# Patient Record
Sex: Female | Born: 1950 | Race: White | Hispanic: No | Smoking: Never smoker
Health system: Southern US, Community
[De-identification: ages and names within clinical notes are randomized; demographics above are authoritative.]

## PROBLEM LIST (undated history)

## (undated) DIAGNOSIS — I071 Rheumatic tricuspid insufficiency: Secondary | ICD-10-CM

## (undated) DIAGNOSIS — M8580 Other specified disorders of bone density and structure, unspecified site: Secondary | ICD-10-CM

## (undated) DIAGNOSIS — F419 Anxiety disorder, unspecified: Secondary | ICD-10-CM

## (undated) DIAGNOSIS — R918 Other nonspecific abnormal finding of lung field: Secondary | ICD-10-CM

## (undated) DIAGNOSIS — E559 Vitamin D deficiency, unspecified: Secondary | ICD-10-CM

## (undated) DIAGNOSIS — E785 Hyperlipidemia, unspecified: Secondary | ICD-10-CM

## (undated) DIAGNOSIS — R0602 Shortness of breath: Secondary | ICD-10-CM

## (undated) DIAGNOSIS — G4733 Obstructive sleep apnea (adult) (pediatric): Secondary | ICD-10-CM

## (undated) DIAGNOSIS — I059 Rheumatic mitral valve disease, unspecified: Secondary | ICD-10-CM

## (undated) DIAGNOSIS — I3481 Nonrheumatic mitral (valve) annulus calcification: Secondary | ICD-10-CM

## (undated) DIAGNOSIS — R079 Chest pain, unspecified: Secondary | ICD-10-CM

## (undated) HISTORY — DX: Other nonspecific abnormal finding of lung field: R91.8

## (undated) HISTORY — DX: Vitamin D deficiency, unspecified: E55.9

## (undated) HISTORY — PX: OOPHORECTOMY: SHX86

## (undated) HISTORY — DX: Nonrheumatic mitral (valve) annulus calcification: I34.81

## (undated) HISTORY — DX: Hyperlipidemia, unspecified: E78.5

## (undated) HISTORY — DX: Chest pain, unspecified: R07.9

## (undated) HISTORY — PX: OTHER SURGICAL HISTORY: SHX169

## (undated) HISTORY — DX: Anxiety disorder, unspecified: F41.9

## (undated) HISTORY — DX: Shortness of breath: R06.02

## (undated) HISTORY — PX: BREAST ENHANCEMENT SURGERY: SHX7

## (undated) HISTORY — DX: Rheumatic tricuspid insufficiency: I07.1

## (undated) HISTORY — DX: Other specified disorders of bone density and structure, unspecified site: M85.80

## (undated) HISTORY — DX: Rheumatic mitral valve disease, unspecified: I05.9

## (undated) HISTORY — DX: Obstructive sleep apnea (adult) (pediatric): G47.33

## (undated) HISTORY — PX: ABDOMINAL HYSTERECTOMY: SHX81

---

## 1979-07-02 HISTORY — PX: AUGMENTATION MAMMAPLASTY: SUR837

## 2000-03-04 ENCOUNTER — Ambulatory Visit (HOSPITAL_COMMUNITY): Admission: RE | Admit: 2000-03-04 | Discharge: 2000-03-04 | Payer: Self-pay | Admitting: Family Medicine

## 2000-03-04 ENCOUNTER — Encounter: Payer: Self-pay | Admitting: Family Medicine

## 2001-03-17 ENCOUNTER — Other Ambulatory Visit: Admission: RE | Admit: 2001-03-17 | Discharge: 2001-03-17 | Payer: Self-pay | Admitting: Internal Medicine

## 2001-11-30 ENCOUNTER — Inpatient Hospital Stay (HOSPITAL_COMMUNITY): Admission: EM | Admit: 2001-11-30 | Discharge: 2001-12-08 | Payer: Self-pay | Admitting: Psychiatry

## 2001-12-09 ENCOUNTER — Other Ambulatory Visit (HOSPITAL_COMMUNITY): Admission: RE | Admit: 2001-12-09 | Discharge: 2001-12-18 | Payer: Self-pay | Admitting: Psychiatry

## 2003-01-18 ENCOUNTER — Ambulatory Visit (HOSPITAL_COMMUNITY): Admission: RE | Admit: 2003-01-18 | Discharge: 2003-01-18 | Payer: Self-pay | Admitting: Internal Medicine

## 2003-01-18 ENCOUNTER — Encounter: Payer: Self-pay | Admitting: Internal Medicine

## 2003-05-24 ENCOUNTER — Encounter: Admission: RE | Admit: 2003-05-24 | Discharge: 2003-05-24 | Payer: Self-pay | Admitting: Internal Medicine

## 2003-07-03 ENCOUNTER — Inpatient Hospital Stay (HOSPITAL_COMMUNITY): Admission: AD | Admit: 2003-07-03 | Discharge: 2003-07-11 | Payer: Self-pay | Admitting: Psychiatry

## 2004-01-27 ENCOUNTER — Other Ambulatory Visit: Admission: RE | Admit: 2004-01-27 | Discharge: 2004-01-27 | Payer: Self-pay | Admitting: Internal Medicine

## 2005-07-24 ENCOUNTER — Ambulatory Visit (HOSPITAL_COMMUNITY): Admission: RE | Admit: 2005-07-24 | Discharge: 2005-07-24 | Payer: Self-pay | Admitting: Internal Medicine

## 2005-08-06 ENCOUNTER — Encounter (INDEPENDENT_AMBULATORY_CARE_PROVIDER_SITE_OTHER): Payer: Self-pay | Admitting: Specialist

## 2005-08-07 ENCOUNTER — Inpatient Hospital Stay (HOSPITAL_COMMUNITY): Admission: RE | Admit: 2005-08-07 | Discharge: 2005-08-08 | Payer: Self-pay | Admitting: Obstetrics and Gynecology

## 2006-04-28 ENCOUNTER — Ambulatory Visit: Payer: Self-pay | Admitting: Psychiatry

## 2006-04-28 ENCOUNTER — Inpatient Hospital Stay (HOSPITAL_COMMUNITY): Admission: RE | Admit: 2006-04-28 | Discharge: 2006-05-05 | Payer: Self-pay | Admitting: Psychiatry

## 2006-06-17 ENCOUNTER — Ambulatory Visit: Payer: Self-pay | Admitting: Internal Medicine

## 2006-06-17 LAB — CONVERTED CEMR LAB
ALT: 14 units/L (ref 0–40)
AST: 19 units/L (ref 0–37)
Albumin: 3.7 g/dL (ref 3.5–5.2)
Alkaline Phosphatase: 53 units/L (ref 39–117)
BUN: 10 mg/dL (ref 6–23)
Basophils Absolute: 0 10*3/uL (ref 0.0–0.1)
Basophils Relative: 0 % (ref 0.0–1.0)
Bilirubin Urine: NEGATIVE
CO2: 26 meq/L (ref 19–32)
Calcium: 9.4 mg/dL (ref 8.4–10.5)
Chloride: 105 meq/L (ref 96–112)
Chol/HDL Ratio, serum: 3.1
Cholesterol: 201 mg/dL (ref 0–200)
Creatinine, Ser: 0.9 mg/dL (ref 0.4–1.2)
Crystals: NEGATIVE
Eosinophil percent: 2 % (ref 0.0–5.0)
GFR calc non Af Amer: 69 mL/min
Glomerular Filtration Rate, Af Am: 83 mL/min/{1.73_m2}
Glucose, Bld: 94 mg/dL (ref 70–99)
HCT: 38.5 % (ref 36.0–46.0)
HDL: 64.5 mg/dL (ref >39.0)
Hemoglobin: 12.7 g/dL (ref 12.0–15.0)
Ketones, ur: NEGATIVE mg/dL
LDL Cholesterol: 125 mg/dL — ABNORMAL HIGH (ref 0–99)
Lymphocytes Relative: 32.8 % (ref 12.0–46.0)
MCHC: 33 g/dL (ref 30.0–36.0)
MCV: 87.1 fL (ref 78.0–100.0)
Monocytes Absolute: 0.2 10*3/uL (ref 0.2–0.7)
Monocytes Relative: 4.5 % (ref 3.0–11.0)
Neutro Abs: 3 10*3/uL (ref 1.4–7.7)
Neutrophils Relative %: 60.7 % (ref 43.0–77.0)
Nitrite: NEGATIVE
Platelets: 244 10*3/uL (ref 150–400)
Potassium: 4.3 meq/L (ref 3.5–5.1)
RBC: 4.42 M/uL (ref 3.87–5.11)
RDW: 12.3 % (ref 11.5–14.6)
Sodium: 138 meq/L (ref 135–145)
Specific Gravity, Urine: 1.015 (ref 1.000–1.03)
TSH: 1.53 microintl units/mL (ref 0.35–5.50)
Total Bilirubin: 0.7 mg/dL (ref 0.3–1.2)
Total Protein: 6.5 g/dL (ref 6.0–8.3)
Triglyceride fasting, serum: 59 mg/dL (ref 0–149)
Urine Glucose: NEGATIVE mg/dL
Urobilinogen, UA: 0.2 (ref 0.0–1.0)
VLDL: 12 mg/dL (ref 0–40)
WBC: 4.9 10*3/uL (ref 4.5–10.5)
pH: 7.5 (ref 5.0–8.0)

## 2006-06-26 ENCOUNTER — Ambulatory Visit: Payer: Self-pay | Admitting: Internal Medicine

## 2007-05-12 ENCOUNTER — Ambulatory Visit (HOSPITAL_COMMUNITY): Admission: RE | Admit: 2007-05-12 | Discharge: 2007-05-12 | Payer: Self-pay | Admitting: Internal Medicine

## 2007-05-26 ENCOUNTER — Telehealth (INDEPENDENT_AMBULATORY_CARE_PROVIDER_SITE_OTHER): Payer: Self-pay | Admitting: *Deleted

## 2007-05-26 DIAGNOSIS — E785 Hyperlipidemia, unspecified: Secondary | ICD-10-CM

## 2007-05-26 DIAGNOSIS — F329 Major depressive disorder, single episode, unspecified: Secondary | ICD-10-CM

## 2007-05-26 DIAGNOSIS — F411 Generalized anxiety disorder: Secondary | ICD-10-CM | POA: Insufficient documentation

## 2007-05-26 DIAGNOSIS — G43009 Migraine without aura, not intractable, without status migrainosus: Secondary | ICD-10-CM | POA: Insufficient documentation

## 2007-05-26 DIAGNOSIS — F3289 Other specified depressive episodes: Secondary | ICD-10-CM | POA: Insufficient documentation

## 2007-11-25 ENCOUNTER — Other Ambulatory Visit: Admission: RE | Admit: 2007-11-25 | Discharge: 2007-11-25 | Payer: Self-pay | Admitting: Family Medicine

## 2007-12-28 ENCOUNTER — Encounter: Admission: RE | Admit: 2007-12-28 | Discharge: 2007-12-28 | Payer: Self-pay | Admitting: Cardiology

## 2008-03-17 ENCOUNTER — Inpatient Hospital Stay (HOSPITAL_BASED_OUTPATIENT_CLINIC_OR_DEPARTMENT_OTHER): Admission: RE | Admit: 2008-03-17 | Discharge: 2008-03-17 | Payer: Self-pay | Admitting: Cardiology

## 2008-10-17 ENCOUNTER — Ambulatory Visit (HOSPITAL_COMMUNITY): Admission: RE | Admit: 2008-10-17 | Discharge: 2008-10-17 | Payer: Self-pay | Admitting: Family Medicine

## 2010-04-02 ENCOUNTER — Ambulatory Visit (HOSPITAL_COMMUNITY): Admission: RE | Admit: 2010-04-02 | Discharge: 2010-04-02 | Payer: Self-pay | Admitting: Family Medicine

## 2010-07-22 ENCOUNTER — Encounter: Payer: Self-pay | Admitting: Internal Medicine

## 2010-11-13 NOTE — Cardiovascular Report (Signed)
NAMECHRISLYN, SEEDORF                  ACCOUNT NO.:  [id number]   MEDICAL RECORD NO.:  [id number]          PATIENT TYPE:  OIB   LOCATION:  1961                         FACILITY:  [facility]   PHYSICIAN:  Jake Bathe, MD      DATE OF BIRTH:  September 02, 1950   DATE OF PROCEDURE:  03/17/2008  DATE OF DISCHARGE:                            CARDIAC CATHETERIZATION   PROCEDURES:  1. Left heart catheterization.  2. Selective coronary angiography.  3. Left ventriculogram.  4. Right heart catheterization with oxygen saturations and cardiac      outputs.   INDICATIONS:  A 60 year old female with abnormal echocardiogram  demonstrating elevated pulmonary pressures as well as significant  dyspnea with normal nuclear stress test showing no evidence of ischemia.   PROCEDURE DETAILS:  An informed consent obtained.  Risk of stroke, heart  attack, death, and bleeding were explained to the patient at length.  She was placed on the catheterization table and prepped in a sterile  fashion.  Lidocaine 1% was used for local anesthesia, femoral head was  visualized in a fluoroscopy before procedure began.  Using the modified  Seldinger technique, both the femoral vein and femoral artery were  cannulated with 7-French and 4-French sheath respectively.  Swan  catheter/balloon-tipped catheter was used to obtain right-sided heart  pressures and sequential chambers.  Saturations drawn.  Cardiac outputs  were obtained utilizing 2 separate methods.  Next, the pigtail catheter  was used to cross the aortic valve into the left ventricle.  Simultaneous pressures were obtained.  Following this, the Swan catheter  was removed.  A left ventriculogram was then performed using 30 mL of  Omnipaque in the RAO position using power injection.  Gradient across  the aortic valve was then obtained.  This catheter was then exchanged  for a Judkins left #4 catheter, which was used to selectively cannulate  the left main artery and multiple  views with Omnipaque hand injection  were obtained.  This catheter was then exchanged for a no-torque  Williams right catheter, which was used to selectively cannulate the  right coronary artery and multiple views were obtained.  Following the  procedure, both sheaths were removed.  Manual compression held.  The  patient tolerated the procedure well with no hematomas.  She had mild  nausea, which Zofran was given.   CORONARY ARTERY FINDINGS:  1. Left main artery - no angiographically significant coronary artery      disease.  2. Left anterior descending artery - there is one diagonal branch.      This artery then continues to wrap around the apex.  There is no      angiographically significant coronary artery disease.  3. Circumflex artery - this artery gives rise to 2 obtuse marginal      branches.  There is no angiographically significant coronary artery      disease.  4. Right coronary artery - this is a dominant artery giving rise to      the posterior descending artery.  There is no angiographically      significant coronary  artery disease.   LEFT VENTRICULOGRAM:  There are no wall motion abnormalities.  Ejection  fraction 55-60%.  There was catheter-induced mitral regurgitation in  diastole.  No evidence of any physiologic or mitral regurgitation by  echocardiogram.  No evidence of significant mitral regurgitation by  echocardiogram.   RIGHT HEART CATHETERIZATION:  1. Right atrium 12/10 with a mean of 8 mmHg.  2. Right ventricle 30/3 with a mean with an end-diastolic pressure of      12 mmHg.  3. Pulmonary artery 30/13 with a mean of 20 mmHg.  4. Pulmonary capillary wedge pressure - 28/26 with a mean of 20 mmHg.  5. Left ventricular pressure 130 systolic with a left ventricular end-      diastolic pressure of 19 mmHg.  6. Aortic pressure of 130/73 with a mean of 100 mmHg.  7. Fick cardiac output was 4.1 L per minute with a cardiac index of      2.3.  8. Thermal dilution  cardiac output was 3.3 with a cardiac index of      1.9.   OXYGEN SATURATIONS:  1. Aorta or femoral artery 95%, pulmonary artery 64%, and right atrium      73%.  Left ventricle/pulmonary capillary wedge simultaneous - no      evidence of mitral stenosis.  2. Right ventricular hemodynamics with left ventricular hemodynamic      simultaneously showed no evidence of concordance or discordance.      The right ventricular tracing showed a mild square root sign.   IMPRESSION:  1. No angiographically significant coronary artery disease.  2. Normal left ventricular ejection fraction of 55-60% with no wall      motion abnormalities with catheter-induced mitral regurgitation.  3. Normal right-sided heart pressures with mean pulmonary artery      pressure of 20 mmHg.  4. Pulmonary artery systolic pressure was 30 mmHg.  On echocardiogram,      this was estimated at 40-50 mmHg with moderate tricuspid      regurgitation.  5. Mildly elevated left ventricular end-diastolic pressure/pulmonary      capillary wedge pressure indicative of diastolic dysfunction.   PLAN:  Reassuring pulmonary artery pressure with no evidence of  pulmonary hypertension.  Echocardiogram results may have been secondary  to increased cardiac output/contractile performance during  echocardiography.  Her CT angiogram showed no evidence of pulmonary  embolism and no evidence of intrinsic lung disease.  She has been  diagnosed with obstructive sleep apnea and is currently being fitted for  mask.  We will see back in clinic in 2 weeks for followup.  Given  indications of diastolic dysfunction, we would recommend aggressively  managing blood pressure.  She currently is on no antihypertensives;  however, I will add low-dose metoprolol to her drug regimen to improve  relaxation.  If this causes excess fatigue, one may consider low dose  diuretic such as hydrochlorothiazide or perhaps diltiazem.      Jake Bathe, MD   Electronically Signed     MCS/MEDQ  D:  03/17/2008  T:  03/17/2008  Job:  [id number]   cc:   Juluis Rainier, M.D.

## 2010-11-16 NOTE — Discharge Summary (Signed)
NAME:  Tanya Barton, Tanya Barton                  ACCOUNT NO.:  [id number]   MEDICAL RECORD NO.:  [id number]          PATIENT TYPE:  INP   LOCATION:  1621                         FACILITY:  [facility]   PHYSICIAN:  Dineen Kid. Rana Snare, M.D.    DATE OF BIRTH:  1950-11-10   DATE OF ADMISSION:  08/06/2005  DATE OF DISCHARGE:  08/08/2005                                 DISCHARGE SUMMARY   HISTORY OF PRESENT ILLNESS:  Tanya Barton is a 60 year old postmenopausal  female status post bilateral salpingo-oophorectomy last year with bilateral  ovarian cyst.  Has noticed worsening problems with symptoms of pelvic  relaxation including stress urinary incontinence and also digitization for  bowel movements due to a large rectocele.  She decided to go to surgical  intervention, and presents for planned hysterectomy with anterior/posterior  colporrhaphy and mid urethral sling per Dr. Retta Diones.   HOSPITAL COURSE:  The patient underwent laparoscopic assisted vaginal  hysterectomy with anterior/posterior colporrhaphy and mid urethral sling by  Dr. Retta Diones.  The surgery was uncomplicated.  Estimated blood loss during  surgery was 100 cc.  Postoperative care was unremarkable.  She had good  return of bowel function and was ambulating.  However, was unable to void  and on postop day #1 did run a low-grade fever of 100.4.  By postoperative  day #2, she was tolerating a regular diet, ambulating without difficult,  voiding on her own and was afebrile and desired discharge home.  She did  have normoactive bowel sounds.  Her incisions were clean, dry and intact.   CONDITION ON DISCHARGE:  Good.   DISPOSITION:  The patient will be discharged home.   DISCHARGE MEDICATIONS:  1.  Keflex 500 b.i.d. x5 days per Dr. Retta Diones.  2.  Percocet as needed.  3.  Ibuprofen.   DISCHARGE INSTRUCTIONS:  1.  Return for increased pain, fever, bleeding, inability to void.  2.  Follow up in the week and a half to two weeks with both Dr.  Retta Diones      and Dr. Rana Snare.      Dineen Kid Rana Snare, M.D.  Electronically Signed     DCL/MEDQ  D:  08/08/2005  T:  08/08/2005  Job:  [id number]

## 2010-11-16 NOTE — Op Note (Signed)
NAME:  Tanya Barton, Tanya Barton                  ACCOUNT NO.:  [id number]   MEDICAL RECORD NO.:  [id number]          PATIENT TYPE:  OIB   LOCATION:  1621                         FACILITY:  [facility]   PHYSICIAN:  Bertram Millard. Dahlstedt, M.D.DATE OF BIRTH:  [birth year]   DATE OF PROCEDURE:  08/06/2005  DATE OF DISCHARGE:                                 OPERATIVE REPORT   PREOPERATIVE DIAGNOSIS:  Stress urinary incontinence.   POSTOPERATIVE DIAGNOSIS:  Stress urinary incontinence.   PROCEDURE:  Lynx suprapubic sling.   SURGEON:  Mark C. Vernie Ammons, M.D.   ANESTHESIA:  General.   COMPLICATIONS:  None.   BRIEF HISTORY:  [name] is a 60 year old female who presents at this time  for pubovaginal sling. She was originally referred by Dr. Candice Camp in  October for possible treatment of this. She is scheduled to have  laparoscopic-assisted vaginal hysterectomy, possible anterior and posterior  repairs. She underwent evaluation in the office and had a urodynamics. This  showed some urgency component to her incontinence but the majority of her  incontinence was stress in nature. She desires to proceed with surgical  management of the stress incontinence. She is aware of the risks and  complications. These include but are not limited to damage to the bladder,  extrusion of the sling material through the vagina with eventual need to  reoperate, bleeding, retention, among others. She understands these and  agrees to proceed.   DESCRIPTION OF PROCEDURE:  The patient had already undergone a laparoscopic-  assisted vaginal hysterectomy and a posterior repair by Dr. Rana Snare. There was  a midline vaginal incision high within the vaginal vault. This was extended  inferiorly towards the urethral meatus. A catheter was in place. Dissection  was carried on either side of the vaginal incision up to the pubocervical  fascia. Two small puncture sites were made in the suprapubic region on each  side of the midline. The  suprapubic needles were then passed through each  one of these, down on either side of the vaginal incision through the space  of Retzius. Following passage of both needles, cystoscopic view of the  bladder and urethra revealed no injury. The [place] sling was then grasped and  pulled such that the mid part of the sling cradled mid urethra.  Approximately 1 cm of slack was left underneath the urethra. The sheath  overlying the sling was then removed. Good positioning was seen underneath  the urethra. The vaginal incision was then closed using a running #0  Monocryl. A pack was placed in the vagina - gauze with Estrace cream. The  suprapubic wound  sites were then closed with Dermabond after the extraneous sling was  excised. The patient tolerated the procedure well. Estimated low blood loss  was approximately 20 mL. She was awakened and returned to the PACU in stable  condition.      Bertram Millard. Dahlstedt, M.D.  Electronically Signed     SMD/MEDQ  D:  08/06/2005  T:  08/06/2005  Job:  [id number]   cc:   Dineen Kid. Rana Snare, M.D.  Fax: [phone]

## 2010-11-16 NOTE — H&P (Signed)
NAME:  Tanya Barton, Tanya Barton                            ACCOUNT NO.:  [id number]   MEDICAL RECORD NO.:  [id number]                   PATIENT TYPE:  IPS   LOCATION:  0300                                 FACILITY:  [facility]   PHYSICIAN:  Jeanice Lim, M.D.              DATE OF BIRTH:  1951-05-24   DATE OF ADMISSION:  07/03/2003  DATE OF DISCHARGE:                                HISTORY & PHYSICAL   IDENTIFYING INFORMATION:  A 60 year old divorced white female, voluntarily  admitted on July 04, 2003.   HISTORY OF PRESENT ILLNESS:  The patient presents with a history of  increasing depression, having panic attacks.  She has been crying all the  time.  She is wondering what's it all about.  She has been lining up all  her medications.  Considering possible overdose.  She has been having a lot  of difficulty sleeping, problems especially with middle insomnia.  Her  stressors include her son is an alcoholic, who is now disrespectful towards  her and her son is currently living with his alcoholic father.  She states  that she knew she needed help when she began having some suicidal thoughts.  She states her appetite has been satisfactory.  She denies any psychotic  symptoms, reports some racing thoughts.   PAST PSYCHIATRIC HISTORY:  Second hospitalization, was here in 2003 for  depression and some passive suicidal thoughts, no current outpatient  treatment.  Was seeing Dr. Katrinka Blazing.  No history of a suicide attempt, and does  go to [employer].   SOCIAL HISTORY:  She is a 60 year old divorced white female, divorced for 17  years.  Has 2 children both adults.  She lives alone.  She is on disability.  No legal problems.   FAMILY HISTORY:  Denies.   ALCOHOL DRUG HISTORY:  Nonsmoker, denies any drug use or alcohol.   PAST MEDICAL HISTORY:  Primary care Keaston Pile is Dr. Gabriel Rung at [facility].  Medical problems are hypertension.   MEDICATIONS:  Lexapro 20 mg daily, has been on it for 1 year, Xanax  t.i.d.,  has been on it for, as she states, a long time.  Verapamil 240 mg daily,  Relafen for hip pain and trazodone for sleep.   DRUG ALLERGIES:  Z-PAK.   PHYSICAL EXAMINATION:  Performed.  There were no significant findings.  The  patient is a well-nourished middle-aged female.  Her vital signs are stable:  98, 60, 16 respirations, blood pressure is 120/80.   LABORATORY DATA:  CBC is within normal limits.  CMET:  Has blood sugar of  100.   MENTAL STATUS EXAM:  She is an alert, oriented, calm female, cooperative.  Speech clear, mood is depressed and anxious.  The patient is teary-eyed at  times.  Thought process:  The patient is very worried over her children but  does not appear to be distracted by any  internal stimuli.  Cognitive  function intact.  Memory is good, judgment and insight is good.   ADMISSION DIAGNOSES:   AXIS I:  Major depressive disorder, recurrent.   AXIS II:  Deferred.   AXIS III:  Hypertension.   AXIS IV:  Problems with primary support group, other psychosocial problems.   AXIS V:  Current is 30, past year 60.   PLAN:  Voluntary admission for suicidal ideation.  Contract for safety,  check every 15 minutes.  Will stabilize mood and thinking so the patient can  be safe and functional.  We will increase an antidepressant to decrease  depressive symptoms.  Will add Risperdal for obsessive thinking and  ruminating over worries.  Case manager to look at follow-up for the patient.  The patient is to continue to be medication compliant and to continue going  to [employer].   TENTATIVE LENGTH OF CARE:  3-5 days.     Landry Corporal, N.P.                       Jeanice Lim, M.D.    JO/MEDQ  D:  07/08/2003  T:  07/08/2003  Job:  [id number]

## 2010-11-16 NOTE — Op Note (Signed)
NAME:  Tanya Barton, Tanya Barton                  ACCOUNT NO.:  [id number]   MEDICAL RECORD NO.:  [id number]          PATIENT TYPE:  INP   LOCATION:  1621                         [facility]ACILITY:  [facility]   PHYSICIAN:  Dineen Kid. Rana Snare, M.D.    DATE OF BIRTH:  02-07-1951   DATE OF PROCEDURE:  08/06/2005  DATE OF DISCHARGE:                                 OPERATIVE REPORT   PREOPERATIVE DIAGNOSES:  Symptomatic pelvic relaxation with cystocele and  rectocele and stress urinary incontinence.   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   PROCEDURE:  Laparoscopically assisted vaginal hysterectomy with anterior and  posterior colporrhaphy and mid urethral sling by Dr. Retta Diones.   INDICATIONS FOR PROCEDURE:  Tanya Barton is a 60 year old postmenopausal  female status post bilateral salpingo-oophorectomy last year for bilateral  ovarian cyst. Has had worsening problems with symptoms from pelvic  relaxation including stress urinary incontinence and also digitalization  with bowel movements due to large rectocele. She desires definitive surgical  intervention and planned anterior and posterior colporrhaphy with urethral  sling but also plan laparoscopically assisted vaginal hysterectomy. The  risks and benefits of the procedure were discussed at length including but  not limited to, risk of infection, bleeding, damage to bowel, bladder,  ureters, risk of recurrence, risk associated with anesthesia and risk  associated with blood transfusion. She does give her informed consent and  wishes to proceed.   FINDINGS:  Normal appearing uterus, ovaries were surgically absent, normal  appearing liver and appendix.   DESCRIPTION OF PROCEDURE:  After adequate analgesia, the patient placed in  the dorsal lithotomy position, she was sterilely prepped and draped. The  bladder was sterilely drained. A Hulka tenaculum was placed on the cervix  and 1 cm  infraumbilical skin incision was made, a Veress needle was  inserted, the abdomen insufflated with dullness to percussion. An 11 mm  trocar was inserted, a laparoscope was inserted and the above findings were  noted. A 5 mm trocar was inserted to the left of the midline 2  fingerbreadth's from the pubic symphysis. The round ligaments were ligated  and dissected using gyrus grasping and cutting forceps. The broad ligaments  were dissected bilaterally down to the level of the uterine artery with good  hemostasis achieved and care taken to avoid the underlying ureters. Bladder  flap is created elevating the uterovesical junction and dissecting with  endoshears. The abdomen was then desufflated, legs repositioned, a weighted  speculum was placed in the vagina. A posterior colpotomy was performed, the  cervix was circumscribed with Bovie cautery. The uterosacral ligaments were  ligated with a LigaSure and dissected with Mayo scissors. The bladder  pillars were dissected in a similar fashion, anterior vaginal mucosa was  dissected off the anterior uterus. The anterior peritoneum was entered  sharply and Deaver retractors placed underneath the bladder. The inferior  portions of the broad ligament and the uterine vasculature were ligated with  LigaSure. The fundus of the uterus was then delivered into the introitus and  the remaining portion  of the pedicle was dissected and ligated with a  LigaSure instrument. The uterus was then removed intact. The uterosacral  ligaments were identified, suture ligated with a #0 Monocryl suture in a  figure-of-eight fashion. The posterior peritoneum was then closed in a  pursestring fashion. The uterosacral ligaments appeared the have very good  support. They were plicated at the midline and the posterior vaginal mucosa  was closed in a vertical fashion using figure-of-eights of #0 Monocryl  suture. The anterior vaginal mucosa was closed after dissecting more   anteriorly underneath the cystocele. The perivesicular fascia was then  plicated using figure-of-eights of #0 Monocryl suture and the anterior  vaginal mucosa was closed with figure-of-eights of #0 Monocryl suture  digging a small window just below the mid urethra which was not closed so  that Dr. Retta Diones could perform his urethropexy. The peritoneal body was  grasped on each side of the introitus with Allis clamps, a triangular flap  was created with a scalpel. The posterior vaginal mucosa was undermined with  Metzenbaum scissors, dissected in the mid portion and reflected laterally  with Metzenbaum scissors two thirds of the way up the vagina to the apex.  After reflecting the vaginal mucosa laterally, the rectal fascia was then  plicated in the midline using figure-of-eights of #0 Monocryl suture with  good approximation of the perirectal fascia. The excess vaginal mucosa was  then removed and the posterior vaginal mucosa was closed with a 2-0 Monocryl  in running locking fashion. The peritoneal body was then reinforced using a  #0 Monocryl suture pulling the superficial transverse perinei muscles and  bulbocavernosus muscles together on the peritoneal body. The perineal  epidermis was then closed in a subcuticular fashion using the 2-0 Monocryl  suture. During the procedure the blood loss was 100 mL. The patient  tolerated this portion of the procedure well. Dr. Isabel Caprice began with a mid  urethral sling, I proceeded with the laparoscopic evaluation of the pelvis  and after examination of the pelvis and the pedicles, bipolar cautery was  used to cauterize the peritoneal edge bleeders after hemostasis was  achieved. The abdomen was then desufflated, trocars removed, and umbilical  skin incision was closed with a #0 Vicryl interrupted suture in the fascia.  A 3-0 Vicryl Rapide subcuticular suture and the 5 mm site was closed with a 3-0 Vicryl Rapide subcuticular suture. The patient  tolerated the procedure  well and was stable on transfer to the recovery room. Sponge, needle and  instrument count was normal x3. Estimated blood loss was 100 mL. The patient  received 1 gram of Rocephin preoperatively.      Dineen Kid Rana Snare, M.D.  Electronically Signed     DCL/MEDQ  D:  08/07/2005  T:  08/07/2005  Job:  [id number]
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
## 2010-11-16 NOTE — H&P (Signed)
NAME:  ANETH, SCHLAGEL                  ACCOUNT NO.:  [id number]   MEDICAL RECORD NO.:  [id number]          PATIENT TYPE:  AMB   LOCATION:  DAY                          FACILITY:  [facility]   PHYSICIAN:  Dineen Kid. Rana Snare, M.D.    DATE OF BIRTH:  05-13-1951   DATE OF ADMISSION:  08/06/2005  DATE OF DISCHARGE:                                HISTORY & PHYSICAL   HISTORY OF PRESENT ILLNESS:  Ms. Quackenbush is a 60 year old, G5, P2, A3, who  has been having problems with difficulty with bowel movement, intravaginal  pressure, having to digitalize to have a bowel movement. She also has stress  urinary incontinence to pressure anteriorly. Previously had a history of  bilateral ovarian cyst. She had a bilateral salpingo-oophorectomy  laparoscopically back in 2005. She does still have a uterus. She does not  have any problems with bleeding but at this time desires definitive surgical  intervention and treatment for pelvic relaxation due to cystocele and  rectocele. She has been evaluated by Dr. Retta Diones for stress urinary  incontinence and he plans to perform midurethral sling.   Itzelle's past medical history is significant for anxiety which she takes  Klonopin as needed and Lexapro 10 milligrams a day. Hypertension which she  takes verapamil and a baby aspirin a day. She has an allergy to  AZITHROMYCIN.   PAST SURGICAL HISTORY:  She has had two cesarean sections. She has had a  laparoscopic bilateral salpingo-oophorectomy in 2005. She has had five  pregnancies and three miscarriages at approximately [redacted] weeks gestational  age.   PHYSICAL EXAMINATION:  Blood pressure is 120/86. Heart regular rate and  rhythm. Lungs are clear to auscultation bilaterally. Abdomen is nondistended  and nontender. Pelvic exam has normal external genitalia. Bartholin's,  Skene's and urethra shows she does have a second and third degree cystocele  with second degree loss of the UV angle with Valsalva. She does have a third  degree  rectocele. No obvious enterocele is noted. 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 Peroneal body is intact.   IMPRESSION/PLAN:  Symptomatic pelvic relaxation with cystocele, urethrocele,  and rectocele. After discussing multiple options with Janiesha, she desires  surgical intervention. Plan anterior and posterior colporrhaphy with  urethral sling. We will also perform a laparoscopic-assisted vaginal  hysterectomy at the time. Discussed the risks and benefits of the procedure  at length which include but are not limited to risks of infection, bleeding,  damage to the bowel, bladder, ureters, risks associated with bladder surgery  such as incontinence or prolonged catheter wear, possibly that this may  recur in the future. Risks associated with anesthesia, risks associated with  blood transfusion. She does give her informed consent and wishes to proceed.  Currently, her medications include Lexapro at 40 milligrams a day,  Wellbutrin at 150 milligrams a day, Klonopin as t.i.d. She is also on  Trazodone 200 milligrams at bed time.      Dineen Kid Rana Snare, M.D.  Electronically Signed     DCL/MEDQ  D:  08/05/2005  T:  08/06/2005  Job:  [id number]
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
## 2011-03-15 ENCOUNTER — Other Ambulatory Visit (HOSPITAL_COMMUNITY): Payer: Self-pay | Admitting: Family Medicine

## 2011-03-15 DIAGNOSIS — Z1231 Encounter for screening mammogram for malignant neoplasm of breast: Secondary | ICD-10-CM

## 2011-04-01 LAB — POCT I-STAT 3, ART BLOOD GAS (G3+)
Acid-Base Excess: 1
Bicarbonate: 25.3 — ABNORMAL HIGH
O2 Saturation: 95
TCO2: 27
pCO2 arterial: 39.9
pH, Arterial: 7.411 — ABNORMAL HIGH
pO2, Arterial: 77 — ABNORMAL LOW

## 2011-04-01 LAB — POCT I-STAT 3, VENOUS BLOOD GAS (G3P V)
Acid-base deficit: 1
Bicarbonate: 26.1 — ABNORMAL HIGH
Bicarbonate: 26.4 — ABNORMAL HIGH
O2 Saturation: 64
O2 Saturation: 73
TCO2: 28
TCO2: 28
pCO2, Ven: 47
pCO2, Ven: 52 — ABNORMAL HIGH
pH, Ven: 7.314 — ABNORMAL HIGH
pH, Ven: 7.353 — ABNORMAL HIGH
pO2, Ven: 37
pO2, Ven: 41

## 2011-04-04 ENCOUNTER — Other Ambulatory Visit (HOSPITAL_COMMUNITY): Payer: Self-pay | Admitting: Family Medicine

## 2011-04-04 ENCOUNTER — Ambulatory Visit (HOSPITAL_COMMUNITY)
Admission: RE | Admit: 2011-04-04 | Discharge: 2011-04-04 | Disposition: A | Discharge status: Home / Self Care | Payer: Medicare Other | Source: Ambulatory Visit | Attending: Family Medicine | Admitting: Family Medicine

## 2011-04-04 DIAGNOSIS — Z1231 Encounter for screening mammogram for malignant neoplasm of breast: Secondary | ICD-10-CM

## 2011-06-19 ENCOUNTER — Other Ambulatory Visit: Payer: Self-pay | Admitting: Family Medicine

## 2011-06-19 DIAGNOSIS — R911 Solitary pulmonary nodule: Secondary | ICD-10-CM

## 2011-06-27 ENCOUNTER — Ambulatory Visit
Admission: RE | Admit: 2011-06-27 | Discharge: 2011-06-27 | Disposition: A | Discharge status: Home / Self Care | Payer: Medicare Other | Source: Ambulatory Visit | Attending: Family Medicine | Admitting: Family Medicine

## 2011-06-27 DIAGNOSIS — R911 Solitary pulmonary nodule: Secondary | ICD-10-CM

## 2012-04-24 ENCOUNTER — Other Ambulatory Visit (HOSPITAL_COMMUNITY): Payer: Self-pay | Admitting: Family Medicine

## 2012-04-24 DIAGNOSIS — Z1231 Encounter for screening mammogram for malignant neoplasm of breast: Secondary | ICD-10-CM

## 2012-05-21 ENCOUNTER — Ambulatory Visit (HOSPITAL_COMMUNITY)
Admission: RE | Admit: 2012-05-21 | Discharge: 2012-05-21 | Disposition: A | Discharge status: Home / Self Care | Payer: Medicare Other | Source: Ambulatory Visit | Attending: Family Medicine | Admitting: Family Medicine

## 2012-05-21 DIAGNOSIS — Z1231 Encounter for screening mammogram for malignant neoplasm of breast: Secondary | ICD-10-CM | POA: Insufficient documentation

## 2013-04-27 ENCOUNTER — Telehealth: Payer: Self-pay | Admitting: Cardiology

## 2013-04-27 NOTE — Telephone Encounter (Signed)
New problem    For a couple of weeks pt has been having Tachycardia.  More so in the mornings all through the day.   Pt would like a call back please on what she should do.   Thanks!

## 2013-04-28 MED ORDER — METOPROLOL SUCCINATE ER 25 MG PO TB24: 25.0000 mg | ORAL_TABLET | Freq: Every day | ORAL | Status: DC

## 2013-04-28 NOTE — Telephone Encounter (Signed)
Follow up   Pt did not get a call 10/28.   Pt still having Tachycardia.   Needs a call back!

## 2013-04-29 ENCOUNTER — Other Ambulatory Visit (HOSPITAL_COMMUNITY): Payer: Self-pay | Admitting: Family Medicine

## 2013-04-29 DIAGNOSIS — Z1231 Encounter for screening mammogram for malignant neoplasm of breast: Secondary | ICD-10-CM

## 2013-05-26 ENCOUNTER — Other Ambulatory Visit (HOSPITAL_COMMUNITY): Payer: Self-pay | Admitting: Family Medicine

## 2013-05-26 ENCOUNTER — Ambulatory Visit (HOSPITAL_COMMUNITY)
Admission: RE | Admit: 2013-05-26 | Discharge: 2013-05-26 | Disposition: A | Discharge status: Home / Self Care | Payer: Medicare Other | Source: Ambulatory Visit | Attending: Family Medicine | Admitting: Family Medicine

## 2013-05-26 DIAGNOSIS — Z1231 Encounter for screening mammogram for malignant neoplasm of breast: Secondary | ICD-10-CM | POA: Insufficient documentation

## 2013-07-23 ENCOUNTER — Other Ambulatory Visit: Payer: Self-pay | Admitting: Cardiology

## 2013-07-29 ENCOUNTER — Other Ambulatory Visit: Payer: Self-pay | Admitting: *Deleted

## 2013-07-29 MED ORDER — METOPROLOL SUCCINATE ER 25 MG PO TB24: 25.0000 mg | ORAL_TABLET | Freq: Every day | ORAL | Status: DC

## 2013-07-29 NOTE — Telephone Encounter (Signed)
Left message regarding medication metoprolol

## 2013-07-29 NOTE — Telephone Encounter (Signed)
Patient called for metoprolol refill.

## 2013-08-11 ENCOUNTER — Ambulatory Visit (INDEPENDENT_AMBULATORY_CARE_PROVIDER_SITE_OTHER): Payer: Medicare Other | Admitting: Cardiology

## 2013-08-26 ENCOUNTER — Ambulatory Visit: Payer: Medicare Other | Admitting: Cardiology

## 2013-09-06 ENCOUNTER — Encounter: Payer: Self-pay | Admitting: Cardiology

## 2013-09-06 ENCOUNTER — Ambulatory Visit (INDEPENDENT_AMBULATORY_CARE_PROVIDER_SITE_OTHER): Payer: Medicare Other | Admitting: Cardiology

## 2013-09-06 VITALS — BP 120/70 | HR 42 | Ht 63.0 in | Wt 176.0 lb

## 2013-09-06 DIAGNOSIS — F411 Generalized anxiety disorder: Secondary | ICD-10-CM

## 2013-09-06 DIAGNOSIS — R001 Bradycardia, unspecified: Secondary | ICD-10-CM

## 2013-09-06 DIAGNOSIS — E785 Hyperlipidemia, unspecified: Secondary | ICD-10-CM

## 2013-09-06 DIAGNOSIS — F419 Anxiety disorder, unspecified: Secondary | ICD-10-CM

## 2013-09-06 DIAGNOSIS — I498 Other specified cardiac arrhythmias: Secondary | ICD-10-CM

## 2013-09-06 LAB — TSH: TSH: 2.12 u[IU]/mL (ref 0.35–5.50)

## 2013-09-06 NOTE — Progress Notes (Signed)
[address] [place], [place]  [address] Phone: [phone] Fax:  [phone]  Date:  09/06/2013   ID:  Tanya OchsSue F Mcray, DOB 04/18/1951, MRN [id number]  PCP:  No primary provider on file.   History of Present Illness: Tanya OchsSue F Barton is a 63 y.o. female with prior right and left heart catheterization showing no angiographically significant CAD (03/2008), normal EF, normal right-sided heart pressures (prior echocardiogram showed mildly elevated systolic pulmonary pressures but this was dispelled by right heart catheterization) here for followup.   Took metoprolol off with bradycardia. Occasional in the AM pulse was 100-120. When sit down heart rate decreased. Tried metoprolol 25. Still with brady here. No syncope, no dizziness with her bradycardia. She will have occasional anxiety, taking medications as described.    Wt Readings from Last 3 Encounters:  09/06/13 176 lb (79.833 kg)     Past Medical History  Diagnosis Date  . Chest pain     , CATH Right and Left   . Shortness of breath   . Obstructive sleep apnea     , CPAP  . Hyperlipidemia   . Pulmonary nodules   . Osteopenia   . Anxiety   . Tricuspid regurgitation   . Mitral annular calcification   . Vitamin D deficiency     Past Surgical History  Procedure Laterality Date  . Oophorectomy Bilateral   . Abdominal hysterectomy    . Breast enhancement surgery    . Cesarean section      Current Outpatient Prescriptions  Medication Sig Dispense Refill  . aspirin 81 MG tablet Take 81 mg by mouth daily.      . calcium-vitamin D (OSCAL WITH D) 500-200 MG-UNIT per tablet Take 1 tablet by mouth.      . citalopram (CELEXA) 20 MG tablet       . clonazePAM (KLONOPIN) 1 MG tablet       . metoprolol succinate (TOPROL XL) 25 MG 24 hr tablet Take 1 tablet (25 mg total) by mouth daily.  30 tablet  0  . Multiple Vitamins-Minerals (MULTIVITAL-M PO) Take by mouth.      . traZODone (DESYREL) 100 MG tablet        No  current facility-administered medications for this visit.    Allergies:    Allergies  Allergen Reactions  . Azithromycin     Social History:  The patient  reports that she has never smoked. She does not have any smokeless tobacco history on file. She reports that she does not drink alcohol or use illicit drugs.   ROS:  Please see the history of present illness.   Denies any fevers, chills, orthopnea, PND, no chest pain  PHYSICAL EXAM: VS:  BP 120/70  Pulse 42  Ht 5\' 3"  (1.6 m)  Wt 176 lb (79.833 kg)  BMI 31.18 kg/m2 Well nourished, well developed, in no acute distress HEENT: normal Neck: no JVD Cardiac:  normal S1, S2; brady; no murmur Lungs:  clear to auscultation bilaterally, no wheezing, rhonchi or rales Abd: soft, nontender, no hepatomegaly Ext: no edema Skin: warm and dry Neuro: no focal abnormalities noted  EKG:  09/06/13-Sinus bradycardia rate 42 with no other abnormalities  no change from prior EKG Labs: LDL 92 on12/14  ASSESSMENT AND PLAN:  1. Chest pain previously described chest pain now resolved with reassuring cardiac catheterization. 2. Sinus bradycardia-no change from previous. Will stop metoprolol 25. Will check TSH.  3. Anxiety - meds  reviewed.  4. Hyperlipidemia-continue with dietary control. 5. Year f/u  Signed, Donato Schultz, MD [facility]  09/06/2013 1:30 PM

## 2013-09-06 NOTE — Patient Instructions (Signed)
Your physician has recommended you make the following change in your medication:   1. Stop Metoprolol  Your physician recommends that you have labs today: TSH  Your physician wants you to follow-up in: 1 year with Dr. Anne FuSkains, You will receive a reminder letter in the mail two months in advance. If you don't receive a letter, please call our office to schedule the follow-up appointment.

## 2013-09-07 ENCOUNTER — Telehealth: Payer: Self-pay | Admitting: Cardiology

## 2013-09-07 NOTE — Telephone Encounter (Signed)
New message ° ° ° ° ° ° ° ° ° °Pt returning nurses call °

## 2013-09-08 NOTE — Telephone Encounter (Signed)
Patient is returning call, she is not happy that no one has called her back. Please advise.

## 2013-09-08 NOTE — Telephone Encounter (Signed)
Pt was given TSH results/ verbalized understanding.

## 2013-10-04 IMAGING — CT CT CHEST W/O CM
2 of 3 series · 15 of 36 positions shown, 18 images · non-contrast
Comparison: CT 12/28/2007.

CLINICAL DATA: Follow-up pulmonary nodules.  No history of smoking
or malignancy.

CT CHEST WITHOUT CONTRAST
TECHNIQUE: Multidetector CT imaging of the chest was performed
following the standard protocol without IV contrast.

[Series 100: standard · axial · 0.59mm/px · z∈[-311,-76]mm · 12 of 57 slices shown, 15 images]
[im 5/57  mediastinal]
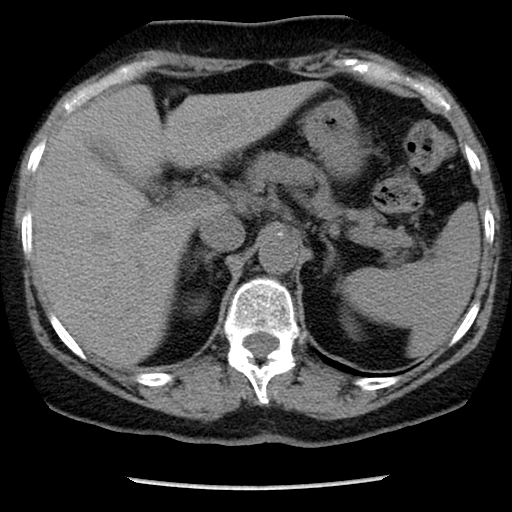
[im 5/57  lung]
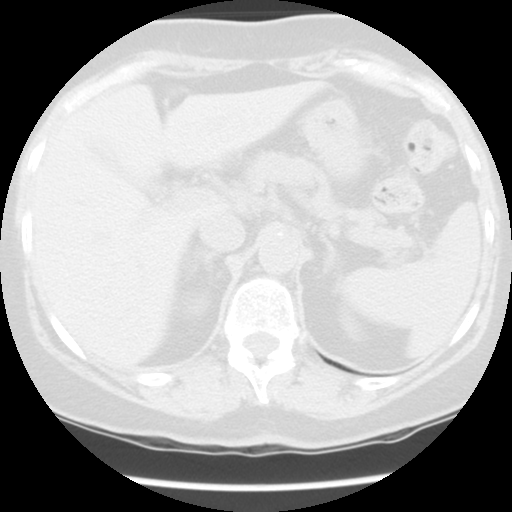
[im 9/57  lung]
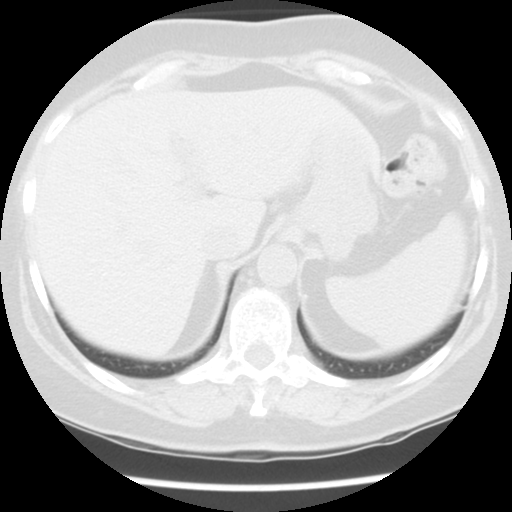
[im 13/57  lung]
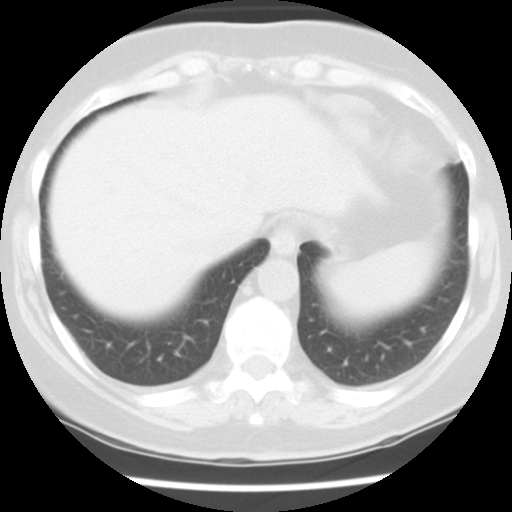
[im 17/57  lung]
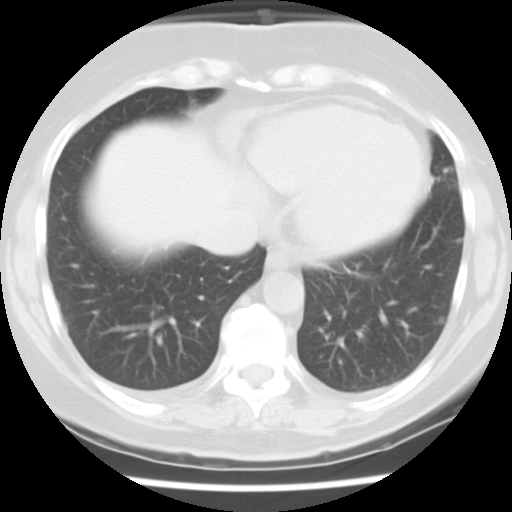
[im 21/57  mediastinal]
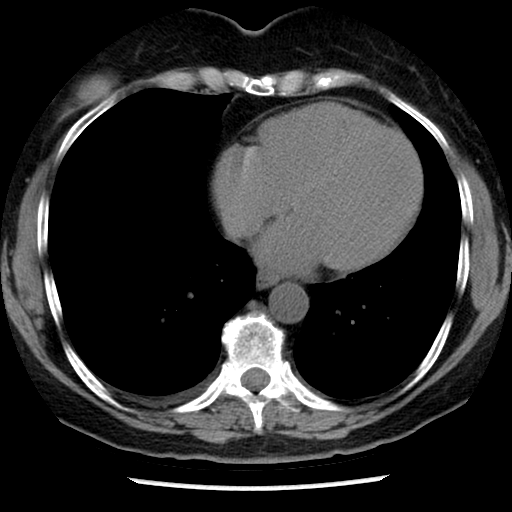
[im 21/57  lung]
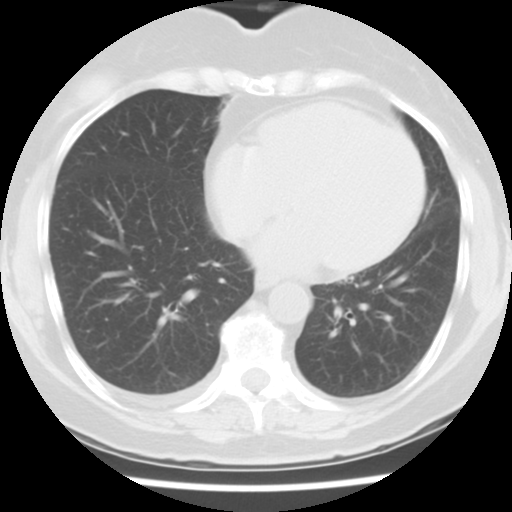
[im 25/57  lung]
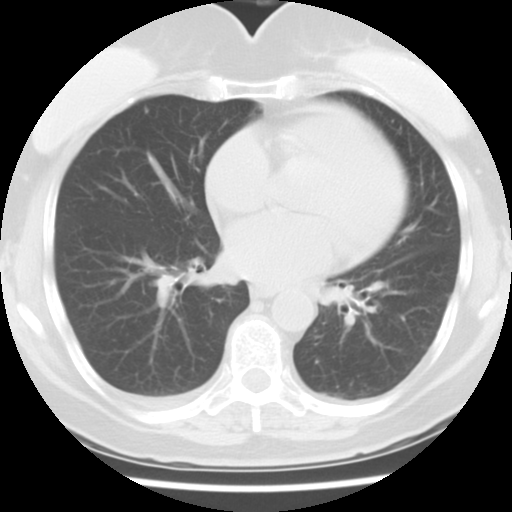
[im 32/57  lung]
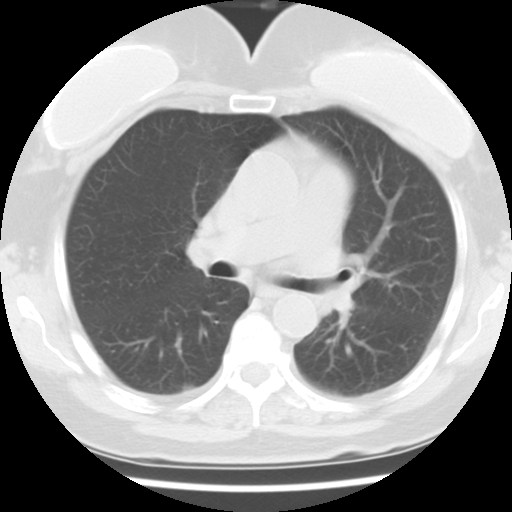
[im 36/57  lung]
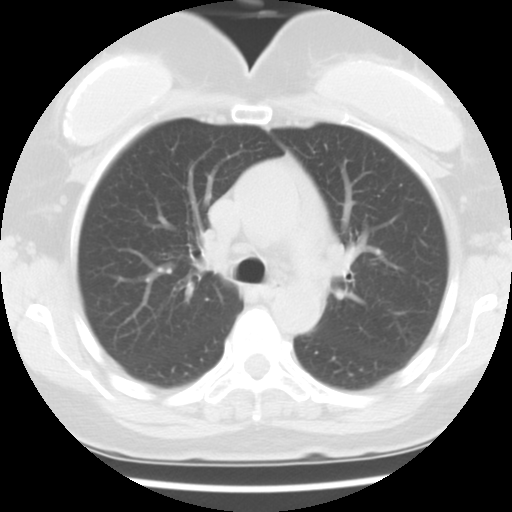
[im 40/57  mediastinal]
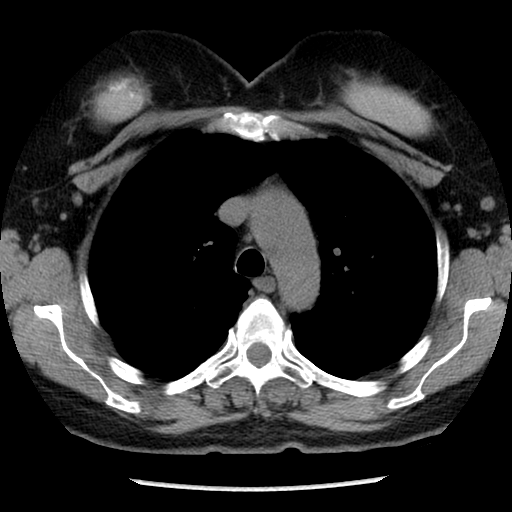
[im 40/57  lung]
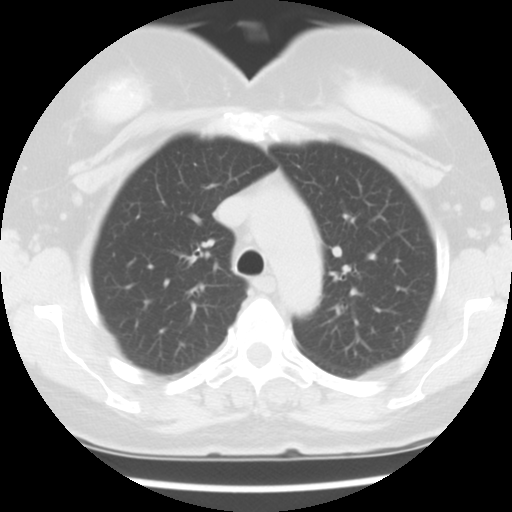
[im 44/57  lung]
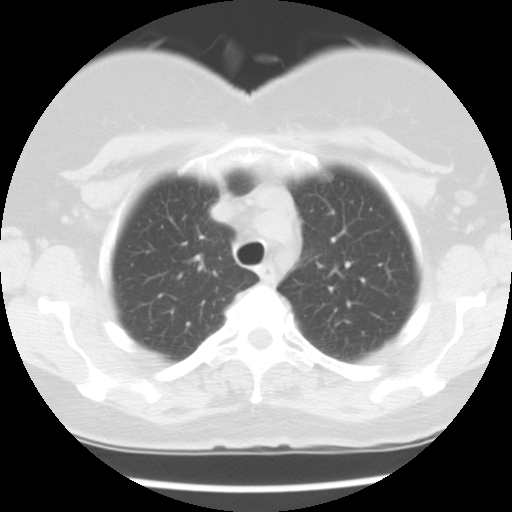
[im 48/57  lung]
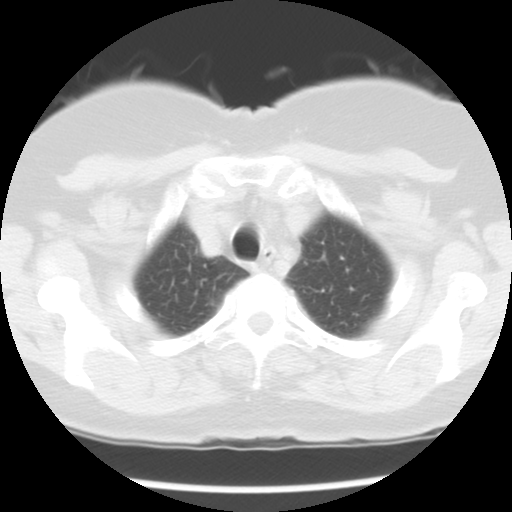
[im 52/57  lung]
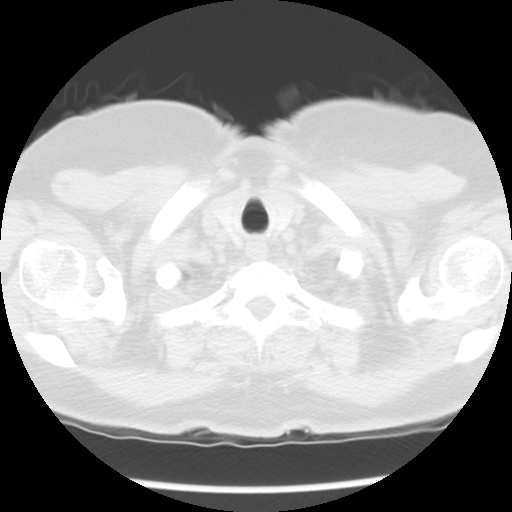

[Series 103: processed images · coronal · 0.59mm/px · 3 of 114 slices shown]
[im 23/114  lung]
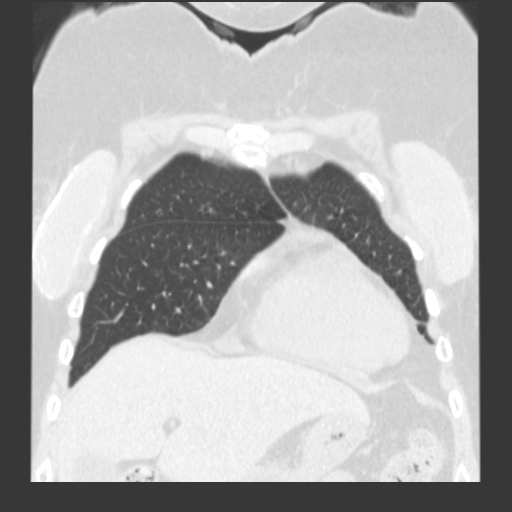
[im 46/114  lung]
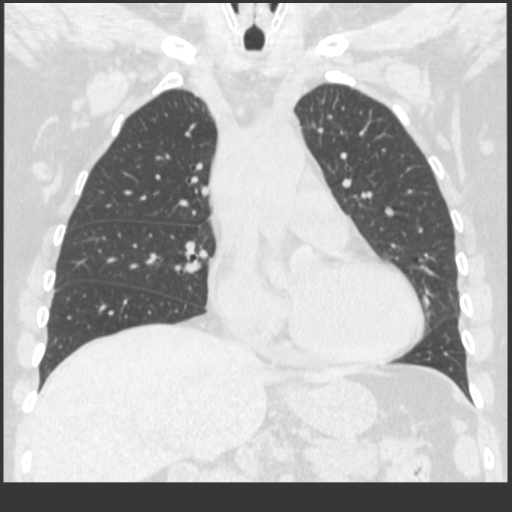
[im 68/114  lung]
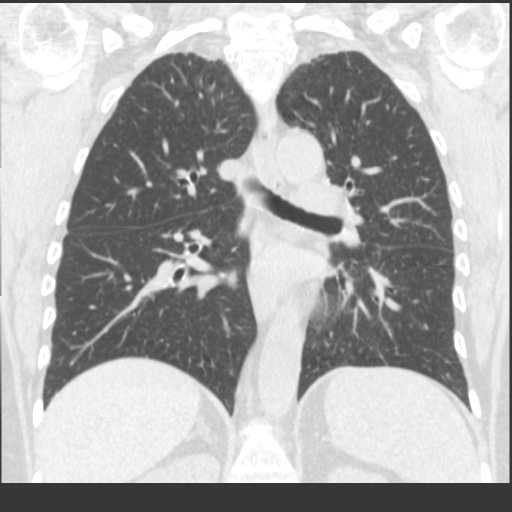

[15 of 36 positions shown; findings below may reference images not displayed]

FINDINGS: The mediastinum has a stable appearance.  There are no
enlarged mediastinal or hilar lymph nodes.  A small amount of
pleural thickening bilaterally appears unchanged.  There is no
significant pleural or pericardial effusion.

Bilateral breast implants with associated capsular calcification
are again noted.  No acute chest wall abnormalities are seen.

The subpleural nodular densities in the left lower lobe are
unchanged, currently seen on images 40 and 41.  There is stable
mild biapical and lingular scarring.  No suspicious pulmonary
nodules are identified.

The visualized upper abdomen appears unremarkable.
IMPRESSION: 1.  Stable subpleural nodularity in the left lower lobe from 3666
consistent with benign postinflammatory scarring.
2.  No acute or suspicious findings identified.

## 2014-02-24 ENCOUNTER — Ambulatory Visit: Payer: Medicare Other | Admitting: Cardiology

## 2014-03-23 ENCOUNTER — Other Ambulatory Visit (HOSPITAL_COMMUNITY): Payer: Self-pay | Admitting: Family Medicine

## 2014-03-23 DIAGNOSIS — Z1231 Encounter for screening mammogram for malignant neoplasm of breast: Secondary | ICD-10-CM

## 2014-05-31 ENCOUNTER — Ambulatory Visit (HOSPITAL_COMMUNITY): Payer: Medicare Other

## 2014-07-04 ENCOUNTER — Ambulatory Visit (HOSPITAL_COMMUNITY)
Admission: RE | Admit: 2014-07-04 | Discharge: 2014-07-04 | Disposition: A | Discharge status: Home / Self Care | Payer: PRIVATE HEALTH INSURANCE | Source: Ambulatory Visit | Attending: Family Medicine | Admitting: Family Medicine

## 2014-07-04 DIAGNOSIS — Z1231 Encounter for screening mammogram for malignant neoplasm of breast: Secondary | ICD-10-CM | POA: Insufficient documentation

## 2014-07-04 DIAGNOSIS — Z9882 Breast implant status: Secondary | ICD-10-CM | POA: Diagnosis not present

## 2014-07-04 DIAGNOSIS — Z1321 Encounter for screening for nutritional disorder: Secondary | ICD-10-CM | POA: Diagnosis present

## 2014-11-30 HISTORY — PX: TYMPANOPLASTY: SHX33

## 2015-03-20 ENCOUNTER — Ambulatory Visit: Payer: Medicare Other | Admitting: Cardiology

## 2015-04-06 ENCOUNTER — Encounter: Payer: Self-pay | Admitting: Cardiology

## 2015-05-26 ENCOUNTER — Ambulatory Visit: Payer: Medicare Other | Admitting: Cardiology

## 2015-05-26 ENCOUNTER — Other Ambulatory Visit (HOSPITAL_COMMUNITY): Payer: Self-pay | Admitting: Psychiatry

## 2015-06-13 ENCOUNTER — Other Ambulatory Visit: Payer: Self-pay

## 2015-06-13 DIAGNOSIS — Z1231 Encounter for screening mammogram for malignant neoplasm of breast: Secondary | ICD-10-CM

## 2015-06-14 ENCOUNTER — Ambulatory Visit: Payer: Medicare Other | Admitting: Cardiology

## 2015-06-28 ENCOUNTER — Ambulatory Visit (INDEPENDENT_AMBULATORY_CARE_PROVIDER_SITE_OTHER): Payer: Medicare Other | Admitting: Cardiology

## 2015-06-28 ENCOUNTER — Encounter: Payer: Self-pay | Admitting: Cardiology

## 2015-06-28 ENCOUNTER — Encounter (INDEPENDENT_AMBULATORY_CARE_PROVIDER_SITE_OTHER): Payer: Medicare Other

## 2015-06-28 VITALS — BP 140/90 | HR 54 | Ht 63.0 in | Wt 152.0 lb

## 2015-06-28 DIAGNOSIS — R001 Bradycardia, unspecified: Secondary | ICD-10-CM | POA: Diagnosis not present

## 2015-06-28 DIAGNOSIS — R000 Tachycardia, unspecified: Secondary | ICD-10-CM

## 2015-06-28 DIAGNOSIS — R079 Chest pain, unspecified: Secondary | ICD-10-CM

## 2015-06-28 DIAGNOSIS — R002 Palpitations: Secondary | ICD-10-CM | POA: Diagnosis not present

## 2015-06-28 NOTE — Progress Notes (Signed)
Cardiology Office Note   Date:  06/28/2015   ID:  Tanya OchsSue F Skillman, DOB 02/25/1951, MRN [id number]  PCP:  Gaye AlkenBARNES,ELIZABETH STEWART, MD Dr. Zachery DauerBarnes  Cardiologist:  Dr. Anne FuSkains    Chief Complaint  Patient presents with  . Palpitations      History of Present Illness: Tanya Barton is a 64 y.o. female who presents for a fib follow up.   She has had a prior right and left heart catheterization 2009 showing no angiographically significant CAD (03/2008), normal EF, normal right-sided heart pressures (prior echocardiogram showed mildly elevated systolic pulmonary pressures but this was dispelled by right heart catheterization) here for followup and frequent palpitations., chest pressure and SOB. Dr. Zachery DauerBarnes has asked us to see.    Took metoprolol off with bradycardia. Occasional in the AM pulse was 100-120. When sit down heart rate decreased. Tried metoprolol 25. She still had brady at 1942 . No syncope, no dizziness with her bradycardia.  Her metoprolol was stopped.    Back today for rapid HR up to 132 when she first wakes up to 115,  She states it feels regular. No syncope occ lightheadedness.  With this she has chest fullness.  No SOB.  Rapid HR has been occurring every AM.    She has OSA and uses BiPap every night.  She continues with anxiety and depression that is treated.  Her son died in March per pt with MI.  He also had OSA and died in his sleep.    Recent labs from Dr. Zachery DauerBarnes.results with TSH 1.76, BMP normal, LDL 111, T chol 188, HDL 60.   Past Medical History  Diagnosis Date  . Chest pain     , CATH Right and Left   . Shortness of breath   . Obstructive sleep apnea     , CPAP  . Hyperlipidemia   . Pulmonary nodules   . Osteopenia   . Anxiety   . Tricuspid regurgitation   . Mitral annular calcification   . Vitamin D deficiency     Past Surgical History  Procedure Laterality Date  . Oophorectomy Bilateral   . Abdominal hysterectomy    . Breast enhancement surgery    .  Cesarean section    . Tympanoplasty  june 2016  . Lt knee meniscus      repair     Current Outpatient Prescriptions  Medication Sig Dispense Refill  . aspirin 81 MG tablet Take 81 mg by mouth daily.    . calcium-vitamin D (OSCAL WITH D) 500-200 MG-UNIT per tablet Take 1 tablet by mouth daily with breakfast.     . citalopram (CELEXA) 20 MG tablet Take 20 mg by mouth daily.     . clonazePAM (KLONOPIN) 1 MG tablet Take 1 mg by mouth 2 (two) times daily. TAKE AN ADDITIONAL  1/2 TAB BY MOUTH AS NEEDED FOR ANXIETY    . Multiple Vitamins-Minerals (MULTIVITAL-M PO) Take 1 tablet by mouth daily.     . traZODone (DESYREL) 100 MG tablet Take 200 mg by mouth at bedtime.      No current facility-administered medications for this visit.    Allergies:   Azithromycin    Social History:  The patient  reports that she has never smoked. She does not have any smokeless tobacco history on file. She reports that she does not drink alcohol or use illicit drugs.   Family History:  The patient's family history includes Heart attack in her father; Hypertension in her father;  Other in her mother; Stroke in her father and paternal grandfather.    ROS:  General:no colds or fevers, decreased weight since 08/2013 Skin:no rashes or ulcers HEENT:no blurred vision, no congestion CV:see HPI PUL:see HPI GI:no diarrhea constipation or melena, no indigestion GU:no hematuria, no dysuria MS:no joint pain, no claudication Neuro:no syncope, no lightheadedness Endo:no diabetes, no thyroid disease  Wt Readings from Last 3 Encounters:  06/28/15 152 lb (68.947 kg)  09/06/13 176 lb (79.833 kg)     PHYSICAL EXAM: VS:  BP 140/90 mmHg  Pulse 54  Ht  (1.6 m)  Wt 152 lb (68.947 kg)  BMI 26.93 kg/m2 , BMI Body mass index is 26.93 kg/(m^2). General:Pleasant affect, NAD Skin:Warm and dry, brisk capillary refill HEENT:normocephalic, sclera clear, mucus membranes moist Neck:supple, no JVD, no bruits  Heart:S1S2 RRR  without murmur, gallup, rub or click Lungs:clear without rales, rhonchi, or wheezes FAO:ZHYQ, non tender, + BS, do not palpate liver spleen or masses Ext:no lower ext edema, 2+ pedal pulses, 2+ radial pulses Neuro:alert and oriented, MAE, follows commands, + facial symmetry    EKG:  EKG is ordered today. The ekg ordered today demonstrates SB at 54, with no acute changes.     Recent Labs: No results found for requested labs within last 365 days.    Lipid Panel    Component Value Date/Time   CHOL 201* 06/17/2006 1337   TRIG 59 06/17/2006 1337   HDL 64.5 06/17/2006 1337   CHOLHDL 3.1 CALC 06/17/2006 1337   VLDL 12 06/17/2006 1337   LDLCALC 125* 06/17/2006 1337       Other studies Reviewed: Additional studies/ records that were reviewed today include:  Previous echo 2009 EF 55-60%, tr MR, moderate TR .   ASSESSMENT AND PLAN:  1.  Tachycardia will do 30 day event monitor- will check echo to reeval.  2. Hx brady to 42 on metoprolol -possible SSS briefly discussed paln depending on arrhthymias.   3. Chest pressure with tachycardia.  May be due to HR alone, but if pain increases or more frequent episodes she will go to ER or cal [place]- at that point would do nuc or if severe cardiac cath.  4. Anxiety treated  5. Hyperlipidemia recent LDL 111. Hx or normla coronary arteries.   Current medicines are reviewed with the patient today.  The patient Has no concerns regarding medicines.  The following changes have been made:  See above Labs/ tests ordered today include:see above  Disposition:   FU:  see above  Nyoka Lint, NP  06/28/2015 6:31 PM    [facility] [address] [place], [place], [place]  [address]/ [address] [place], [place] Phone: [phone]; Fax: [phone]  [phone]

## 2015-06-28 NOTE — Patient Instructions (Signed)
Medication Instructions:  Your physician recommends that you continue on your current medications as directed. Please refer to the Current Medication list given to you today.   Labwork: -None  Testing/Procedures: Your physician has recommended that you wear an event monitor. Event monitors are medical devices that record the heart's electrical activity. Doctors most often us these monitors to diagnose arrhythmias. Arrhythmias are problems with the speed or rhythm of the heartbeat. The monitor is a small, portable device. You can wear one while you do your normal daily activities. This is usually used to diagnose what is causing palpitations/syncope (passing out).  Your physician has requested that you have an echocardiogram. Echocardiography is a painless test that uses sound waves to create images of your heart. It provides your doctor with information about the size and shape of your heart and how well your heart's chambers and valves are working. This procedure takes approximately one hour. There are no restrictions for this procedure.     Follow-Up: Your physician recommends that you keep your scheduled  follow-up appointment with Dr. Anne FuSkains   Any Other Special Instructions Will Be Listed Below (If Applicable).     If you need a refill on your cardiac medications before your next appointment, please call your pharmacy.

## 2015-06-30 ENCOUNTER — Encounter: Payer: Self-pay | Admitting: Cardiology

## 2015-07-10 ENCOUNTER — Other Ambulatory Visit (HOSPITAL_COMMUNITY): Payer: Medicare Other

## 2015-07-17 ENCOUNTER — Ambulatory Visit: Payer: Medicare Other

## 2015-07-18 ENCOUNTER — Other Ambulatory Visit: Payer: Self-pay

## 2015-07-18 ENCOUNTER — Ambulatory Visit (HOSPITAL_COMMUNITY): Payer: Medicare Other | Attending: Cardiology

## 2015-07-18 DIAGNOSIS — R0789 Other chest pain: Secondary | ICD-10-CM

## 2015-07-18 DIAGNOSIS — I358 Other nonrheumatic aortic valve disorders: Secondary | ICD-10-CM | POA: Insufficient documentation

## 2015-07-18 DIAGNOSIS — Z8249 Family history of ischemic heart disease and other diseases of the circulatory system: Secondary | ICD-10-CM | POA: Diagnosis not present

## 2015-07-18 DIAGNOSIS — R000 Tachycardia, unspecified: Secondary | ICD-10-CM | POA: Insufficient documentation

## 2015-07-18 DIAGNOSIS — I351 Nonrheumatic aortic (valve) insufficiency: Secondary | ICD-10-CM | POA: Diagnosis not present

## 2015-07-18 DIAGNOSIS — I340 Nonrheumatic mitral (valve) insufficiency: Secondary | ICD-10-CM | POA: Diagnosis not present

## 2015-07-18 DIAGNOSIS — E785 Hyperlipidemia, unspecified: Secondary | ICD-10-CM | POA: Diagnosis not present

## 2015-07-31 ENCOUNTER — Ambulatory Visit
Admission: RE | Admit: 2015-07-31 | Discharge: 2015-07-31 | Disposition: A | Discharge status: Home / Self Care | Payer: Medicare Other | Source: Ambulatory Visit

## 2015-07-31 DIAGNOSIS — Z1231 Encounter for screening mammogram for malignant neoplasm of breast: Secondary | ICD-10-CM

## 2015-08-01 ENCOUNTER — Ambulatory Visit (INDEPENDENT_AMBULATORY_CARE_PROVIDER_SITE_OTHER): Payer: Medicare Other | Admitting: Cardiology

## 2015-08-01 ENCOUNTER — Encounter: Payer: Self-pay | Admitting: Cardiology

## 2015-08-01 VITALS — BP 150/74 | HR 64 | Ht 63.0 in | Wt 158.4 lb

## 2015-08-01 DIAGNOSIS — R000 Tachycardia, unspecified: Secondary | ICD-10-CM

## 2015-08-01 DIAGNOSIS — R079 Chest pain, unspecified: Secondary | ICD-10-CM

## 2015-08-01 DIAGNOSIS — F419 Anxiety disorder, unspecified: Secondary | ICD-10-CM

## 2015-08-01 DIAGNOSIS — R001 Bradycardia, unspecified: Secondary | ICD-10-CM | POA: Diagnosis not present

## 2015-08-01 DIAGNOSIS — R002 Palpitations: Secondary | ICD-10-CM | POA: Diagnosis not present

## 2015-08-01 NOTE — Progress Notes (Signed)
[address] [place], [place]  [address] Phone: [phone] Fax:  [phone]  Date:  08/01/2015   ID:  Tanya Barton, DOB 08/26/1950, MRN [id number]  PCP:  Gaye Alken, MD   History of Present Illness: Tanya Barton is a 65 y.o. female with prior right and left heart catheterization showing no angiographically significant CAD (03/2008), normal EF, normal right-sided heart pressures (prior echocardiogram showed mildly elevated systolic pulmonary pressures but this was dispelled by right heart catheterization) [place] for followup palpitations[place]   she was previously seen on 06/28/15 with complaints of rapid heart rate up to 132 bpm when she first wakes up 115. Regular feeling. No syncope but occasionally lightheaded. She has chest fullness with this. No shortness of breath. The rapid heart rate had been occurring every morning.   She also has obstructive sleep apnea and uses BiPAP every night. She has had anxiety and depression that is treated. Her son died in 04/16/2016with myocardial infarction.   TSH was 1.7 , LDL 111.   The previously discontinued her metoprolol secondary to bradycardia. In the past we have tried metoprolol 25 mg once a day but she remained bradycardic at rest.   Nada Boozer ordered an event monitor at the end of December 2016.      Wt Readings from Last 3 Encounters:  08/01/15 158 lb 6.4 oz (71.85 kg)  06/28/15 152 lb (68.947 kg)  09/06/13 176 lb (79.833 kg)     Past Medical History  Diagnosis Date  . Chest pain     , CATH Right and Left   . Shortness of breath   . Obstructive sleep apnea     , CPAP  . Hyperlipidemia   . Pulmonary nodules   . Osteopenia   . Anxiety   . Tricuspid regurgitation   . Mitral annular calcification   . Vitamin D deficiency     Past Surgical History  Procedure Laterality Date  . Oophorectomy Bilateral   . Abdominal hysterectomy    . Breast enhancement surgery    .  Cesarean section    . Tympanoplasty  june 2016  . Lt knee meniscus      repair    Current Outpatient Prescriptions  Medication Sig Dispense Refill  . aspirin 81 MG tablet Take 81 mg by mouth daily.    . calcium-vitamin D (OSCAL WITH D) 500-200 MG-UNIT per tablet Take 1 tablet by mouth daily with breakfast.     . citalopram (CELEXA) 20 MG tablet Take 20 mg by mouth daily.     . clonazePAM (KLONOPIN) 1 MG tablet Take 1 mg by mouth 2 (two) times daily. TAKE AN ADDITIONAL  1/2 TAB BY MOUTH AS NEEDED FOR ANXIETY    . Multiple Vitamins-Minerals (MULTIVITAL-M PO) Take 1 tablet by mouth daily.     . traZODone (DESYREL) 100 MG tablet Take 200 mg by mouth at bedtime.      No current facility-administered medications for this visit.    Allergies:    Allergies  Allergen Reactions  . Azithromycin     Social History:  The patient  reports that she has never smoked. She does not have any smokeless tobacco history on file. She reports that she does not drink alcohol or use illicit drugs.   ROS:  Please see the history of present illness.   Denies any fevers, chills, orthopnea, PND, no chest pain  PHYSICAL EXAM: VS:  BP 150/74 mmHg  Pulse 64  Ht  (1.6 m)  Wt 158 lb 6.4 oz (71.85 kg)  BMI 28.07 kg/m2 Well nourished, well developed, in no acute distress HEENT: normal Neck: no JVD Cardiac:  normal S1, S2; brady; no murmur Lungs:  clear to auscultation bilaterally, no wheezing, rhonchi or rales Abd: soft, nontender, no hepatomegaly Ext: no edema Skin: warm and dry Neuro: no focal abnormalities noted, teared up  EKG:  09/06/13-Sinus bradycardia rate 42 with no other abnormalities  no change from prior EKG Labs: LDL 92 on12/14  ASSESSMENT AND PLAN:  1.  palpitations/tachycardia- awaiting results of event monitor.  I want to make sure that this is not atrial fibrillation. It does sound like she has a degree of autonomic changes in the morning or perhaps a vagal trigger for her  tachycardia. For instance she notices when she stands up. We've not used AV nodal blocking agents recently because of resting bradycardia. 2. Chest pain previously described chest pain now resolved with reassuring cardiac catheterization. 3. Sinus bradycardia-no change from previous.  4. Anxiety - meds reviewed.  5. Hyperlipidemia-continue with dietary control. 6. Year f/u  Signed, Donato Schultz, MD [facility]  08/01/2015 8:52 AM

## 2015-08-01 NOTE — Patient Instructions (Signed)
Medication Instructions:  The current medical regimen is effective;  continue present plan and medications.  Labwork: None   Testing/Procedures: None  Follow-Up: Follow up in 1 year with Dr. Skains.  You will receive a letter in the mail 2 months before you are due.  Please call us when you receive this letter to schedule your follow up appointment.  If you need a refill on your cardiac medications before your next appointment, please call your pharmacy.  Thank you for choosing [facility]!!     

## 2015-08-07 ENCOUNTER — Telehealth: Payer: Self-pay | Admitting: Cardiology

## 2015-08-07 NOTE — Telephone Encounter (Signed)
New message      Want event monitor results

## 2015-08-10 NOTE — Telephone Encounter (Signed)
Follow up      Calling to get monitor resutlts

## 2015-08-10 NOTE — Telephone Encounter (Signed)
Left message to call back for results

## 2015-08-10 NOTE — Telephone Encounter (Signed)
No adverse arrhythmias       No atrial fibrillation      Resting heart rate at times in the 40's BPM. Asymptomatic      Occasional sinus tachycardia in 120-130's.        Reassuring.         Donato Schultz, MD

## 2015-08-10 NOTE — Telephone Encounter (Signed)
Reviewed results with pt who states understanding. Has also seen PCP about tachycardia who thinks it may be r/t anxiety/stress.

## 2015-08-31 ENCOUNTER — Other Ambulatory Visit (HOSPITAL_COMMUNITY): Payer: Self-pay | Admitting: Psychiatry

## 2015-08-31 ENCOUNTER — Encounter: Payer: Medicare Other | Admitting: Cardiology

## 2015-12-14 ENCOUNTER — Ambulatory Visit (INDEPENDENT_AMBULATORY_CARE_PROVIDER_SITE_OTHER): Payer: Medicare Other | Admitting: Cardiology

## 2015-12-14 ENCOUNTER — Encounter: Payer: Self-pay | Admitting: Cardiology

## 2015-12-14 VITALS — BP 130/72 | HR 63 | Ht 63.0 in | Wt 165.0 lb

## 2015-12-14 DIAGNOSIS — G4733 Obstructive sleep apnea (adult) (pediatric): Secondary | ICD-10-CM | POA: Diagnosis not present

## 2015-12-14 NOTE — Patient Instructions (Signed)
Medication Instructions:  Your physician recommends that you continue on your current medications as directed. Please refer to the Current Medication list given to you today.   Labwork: None  Testing/Procedures: None  Follow-Up: Your physician wants you to follow-up in: 12 months with Dr. Turner. You will receive a reminder letter in the mail two months in advance. If you don't receive a letter, please call our office to schedule the follow-up appointment.   Any Other Special Instructions Will Be Listed Below (If Applicable).     If you need a refill on your cardiac medications before your next appointment, please call your pharmacy.   

## 2015-12-14 NOTE — Progress Notes (Signed)
Cardiology Office Note    Date:  12/14/2015   ID:  Tanya Barton, DOB 03/14/1951, MRN [id number]  PCP:  Gaye AlkenBARNES,ELIZABETH STEWART, MD  Cardiologist:  Armanda Magicraci Kayliana Codd, MD   Chief Complaint  Patient presents with  . Sleep Apnea    History of Present Illness:  Tanya Barton is a 65 y.o. female with a history of OSA on BIPAP.  She has not been followed by me in some time.  She is doing well with her BIPAP device.  She tolerates her full face mask and feels the pressure is adequate.  She feels rested in the am and has no daytime sleepiness.  She denies any mouth dryness or nasal congestion. She does not know if she snores.      Past Medical History  Diagnosis Date  . Chest pain     normal right and left heart cath with elevated LVEDP  . Shortness of breath   . Hyperlipidemia   . Pulmonary nodules   . Osteopenia   . Anxiety   . Tricuspid regurgitation   . Mitral annular calcification   . Vitamin D deficiency   . OSA (obstructive sleep apnea)     On BIPAP    Past Surgical History  Procedure Laterality Date  . Oophorectomy Bilateral   . Abdominal hysterectomy    . Breast enhancement surgery    . Cesarean section    . Tympanoplasty  june 2016  . Lt knee meniscus      repair    Current Medications: Outpatient Prescriptions Prior to Visit  Medication Sig Dispense Refill  . aspirin 81 MG tablet Take 81 mg by mouth daily.    . calcium-vitamin D (OSCAL WITH D) 500-200 MG-UNIT per tablet Take 1 tablet by mouth daily with breakfast.     . citalopram (CELEXA) 20 MG tablet Take 20 mg by mouth daily.     . clonazePAM (KLONOPIN) 1 MG tablet Take 1 mg by mouth 2 (two) times daily. TAKE AN ADDITIONAL  1/2 TAB BY MOUTH AS NEEDED FOR ANXIETY    . Multiple Vitamins-Minerals (MULTIVITAL-M PO) Take 1 tablet by mouth daily.     . traZODone (DESYREL) 100 MG tablet Take 200 mg by mouth at bedtime.      No facility-administered medications prior to visit.     Allergies:   Azithromycin    Social History   Social History  . Marital Status: Divorced    Spouse Name: N/A  . Number of Children: N/A  . Years of Education: N/A   Social History Main Topics  . Smoking status: Never Smoker   . Smokeless tobacco: None  . Alcohol Use: No  . Drug Use: No  . Sexual Activity: Not Asked   Other Topics Concern  . None   Social History Narrative     Family History:  The patient's family history includes Heart attack in her father; Hypertension in her father; Other in her mother; Stroke in her father and paternal grandfather.   ROS:   Please see the history of present illness.    Review of Systems  Psychiatric/Behavioral: Positive for depression. The patient is nervous/anxious.    All other systems reviewed and are negative.   PHYSICAL EXAM:   VS:  BP 130/72 mmHg  Pulse 63  Ht 5\' 3"  (1.6 m)  Wt 165 lb (74.844 kg)  BMI 29.24 kg/m2  SpO2 97%   GEN: Well nourished, well developed, in no acute distress HEENT: normal Neck:  no JVD, carotid bruits, or masses Cardiac: RRR; no murmurs, rubs, or gallops,no edema.  Intact distal pulses bilaterally.  Respiratory:  clear to auscultation bilaterally, normal work of breathing GI: soft, nontender, nondistended, + BS MS: no deformity or atrophy Skin: warm and dry, no rash Neuro:  Alert and Oriented x 3, Strength and sensation are intact Psych: euthymic mood, full affect  Wt Readings from Last 3 Encounters:  12/14/15 165 lb (74.844 kg)  08/01/15 158 lb 6.4 oz (71.85 kg)  06/28/15 152 lb (68.947 kg)      Studies/Labs Reviewed:   EKG:  EKG is not ordered today.    Recent Labs: No results found for requested labs within last 365 days.   Lipid Panel    Component Value Date/Time   CHOL 201* 06/17/2006 1337   TRIG 59 06/17/2006 1337   HDL 64.5 06/17/2006 1337   CHOLHDL 3.1 CALC 06/17/2006 1337   VLDL 12 06/17/2006 1337   LDLCALC 125* 06/17/2006 1337    Additional studies/ records that were reviewed today include:   none    ASSESSMENT:    1. OSA (obstructive sleep apnea)      PLAN:  In order of problems listed above:  OSA - the patient is tolerating PAP therapy well without any problems. The PAP download was reviewed today and showed an AHI of 5.2/hr on 16/12 cm H2O with 60% compliance in using more than 4 hours nightly.  She says that she has missed some days using.  The patient has been using and benefiting from CPAP use and will continue to benefit from therapy.       Medication Adjustments/Labs and Tests Ordered: Current medicines are reviewed at length with the patient today.  Concerns regarding medicines are outlined above.  Medication changes, Labs and Tests ordered today are listed in the Patient Instructions below.  There are no Patient Instructions on file for this visit.   Signed, Armanda Magic, MD  12/14/2015 11:46 AM    [facility] [address] [place], [place], [place]  [address] Phone: [phone]; Fax: [phone]

## 2016-02-26 ENCOUNTER — Telehealth: Payer: Self-pay | Admitting: Cardiology

## 2016-02-26 NOTE — Telephone Encounter (Signed)
I spoke with the pt and she contacted the office to make Dr Anne FuSkains aware that [facility].  The pt said she ate dinner on Friday and then laid down on the couch to watch the news.  The pt developed heaviness in her chest, SOB and felt lightheaded and put a pulse ox on her finger which read pulse as 233.  The pt became scared and went to the ER for further evaluation. When the pt arrived her pulse had returned to the 90 range and everything checked out okay. At this time the pt feels fine but she wanted to make us aware of symptoms and see if anything further needs to be done.  The pt does continue to have issues on a daily basis with palpitations.  Pulse this morning 102.  I will forward this message to Dr Anne FuSkains and Avie ArenasPam Fleming RN to review and follow-up with the pt. Pt agreed with plan.

## 2016-02-26 NOTE — Telephone Encounter (Signed)
No further therapy at this time. Glad she checked out well at the ER.  Donato SchultzMark Skains, MD

## 2016-02-26 NOTE — Telephone Encounter (Signed)
New message  Pt call requesting to speak with RN. Pt stated she experienced some chest tightness and a pulse of 233 on 8/25 and went the the hospital. Pt wanted to call and inform Provider of information and to discuss. Please call back

## 2016-02-29 ENCOUNTER — Encounter: Payer: Self-pay | Admitting: Cardiology

## 2016-02-29 NOTE — Telephone Encounter (Signed)
Spoke with pt and reviewed her recent episode.  She has not had any more increased HR or S/S.  She knows to avoid stimulants and we reviewed those.  She is aware Dr Anne FuSkains has read the information as discussed with Julieta GuttingLauren Brown, RN and doesn't wish to make any changes.  She will c/b if further needs, concerns or questions.

## 2016-02-29 NOTE — Telephone Encounter (Signed)
New message ° ° ° ° ° ° °Pt returning nurse call  °

## 2016-07-05 ENCOUNTER — Other Ambulatory Visit: Payer: Self-pay | Admitting: Family Medicine

## 2016-07-05 DIAGNOSIS — Z1231 Encounter for screening mammogram for malignant neoplasm of breast: Secondary | ICD-10-CM

## 2016-08-01 ENCOUNTER — Ambulatory Visit: Payer: Medicare Other

## 2016-08-22 ENCOUNTER — Ambulatory Visit: Payer: Medicare Other

## 2016-09-09 ENCOUNTER — Ambulatory Visit: Payer: Medicare Other

## 2016-09-26 ENCOUNTER — Ambulatory Visit
Admission: RE | Admit: 2016-09-26 | Discharge: 2016-09-26 | Disposition: A | Discharge status: Home / Self Care | Payer: Medicare Other | Source: Ambulatory Visit | Attending: Family Medicine | Admitting: Family Medicine

## 2016-09-26 DIAGNOSIS — Z1231 Encounter for screening mammogram for malignant neoplasm of breast: Secondary | ICD-10-CM

## 2017-01-23 ENCOUNTER — Other Ambulatory Visit (HOSPITAL_COMMUNITY): Payer: Self-pay | Admitting: Psychiatry

## 2017-02-01 ENCOUNTER — Other Ambulatory Visit (HOSPITAL_COMMUNITY): Payer: Self-pay | Admitting: Psychiatry

## 2017-03-20 ENCOUNTER — Other Ambulatory Visit (HOSPITAL_COMMUNITY): Payer: Self-pay | Admitting: Psychiatry

## 2017-07-18 ENCOUNTER — Other Ambulatory Visit: Payer: Self-pay | Admitting: Family Medicine

## 2017-07-18 DIAGNOSIS — Z1231 Encounter for screening mammogram for malignant neoplasm of breast: Secondary | ICD-10-CM

## 2017-08-07 ENCOUNTER — Other Ambulatory Visit (HOSPITAL_COMMUNITY): Payer: Self-pay | Admitting: Psychiatry

## 2017-09-29 ENCOUNTER — Other Ambulatory Visit (HOSPITAL_COMMUNITY): Payer: Self-pay | Admitting: Psychiatry

## 2017-09-29 ENCOUNTER — Ambulatory Visit: Payer: Medicare Other

## 2018-10-29 ENCOUNTER — Telehealth: Payer: Self-pay | Admitting: Cardiology

## 2018-10-29 NOTE — Telephone Encounter (Signed)
Spoke with patient and she has move to [facility] and has a MD there,
